# Patient Record
Sex: Male | Born: 1965 | Race: White | Hispanic: No | Marital: Married
Health system: Southern US, Community
[De-identification: ages and names within clinical notes are randomized; demographics above are authoritative.]

---

## 2019-09-01 ENCOUNTER — Encounter (HOSPITAL_COMMUNITY): Payer: Self-pay

## 2019-09-01 ENCOUNTER — Emergency Department (HOSPITAL_COMMUNITY)
Admission: EM | Admit: 2019-09-01 | Discharge: 2019-09-02 | Disposition: A | Discharge status: Home / Self Care | Payer: BC Managed Care – PPO | Attending: Emergency Medicine | Admitting: Emergency Medicine

## 2019-09-01 DIAGNOSIS — R509 Fever, unspecified: Secondary | ICD-10-CM | POA: Diagnosis not present

## 2019-09-01 DIAGNOSIS — J1289 Other viral pneumonia: Secondary | ICD-10-CM | POA: Diagnosis not present

## 2019-09-01 DIAGNOSIS — R197 Diarrhea, unspecified: Secondary | ICD-10-CM | POA: Diagnosis not present

## 2019-09-01 DIAGNOSIS — J1282 Pneumonia due to coronavirus disease 2019: Secondary | ICD-10-CM

## 2019-09-01 DIAGNOSIS — U071 COVID-19: Secondary | ICD-10-CM | POA: Diagnosis not present

## 2019-09-01 DIAGNOSIS — R05 Cough: Secondary | ICD-10-CM | POA: Diagnosis not present

## 2019-09-01 DIAGNOSIS — R0602 Shortness of breath: Secondary | ICD-10-CM | POA: Diagnosis present

## 2019-09-01 NOTE — ED Triage Notes (Signed)
Pt arrives POV with CC of COVID; Tested positive on Dec. 9th, been feeling sick since 7th. Sx progressively worsening with fever, SOB, and dizziness.

## 2019-09-02 ENCOUNTER — Emergency Department (HOSPITAL_COMMUNITY): Payer: BC Managed Care – PPO

## 2019-09-02 MED ORDER — ONDANSETRON 4 MG PO TBDP: 4.0000 mg | ORAL_TABLET | Freq: Three times a day (TID) | ORAL | 0 refills | Status: AC | PRN

## 2019-09-02 NOTE — ED Notes (Signed)
Patient verbalizes understanding of discharge instructions. Opportunity for questioning and answers were provided. Armband removed by staff, pt discharged from ED ambulatory to home.  

## 2019-09-02 NOTE — Discharge Instructions (Signed)
You were seen today for shortness of breath associated with COVID-19.  Your chest x-ray does show findings consistent with Covid pneumonia.  Your oxygen saturation has stayed 98 to 99% in the emergency room.  Monitor oxygen saturations at home with a portable pulse oximeter.  If your oxygen saturations began to drop into the low 90s, you need to be reseen.  Otherwise, take Tylenol or Motrin for fevers and make sure to stay hydrated.

## 2019-09-02 NOTE — ED Provider Notes (Signed)
[facility] EMERGENCY DEPARTMENT Provider Note   CSN: [id number] Arrival date & time: 09/01/19  2301     History Chief Complaint  Patient presents with  . COVID+    Alex Hayes is a 53 y.o. male.  HPI     This is a 54 year old male with no reported past medical history who presents with shortness of breath.  The patient was diagnosed with COVID-19 on December 9.  He started having symptoms on December 7.  Since that time he reports that he has had recurrent fever up to 101.3 which does improve with Tylenol.  However, today he noted shortness of breath.  He was not doing anything exertional or specific when he noted the shortness of breath.  No chest pain.  He states he has had some nausea and a few loose stools that have been "green."  He reports a nonproductive cough.  History reviewed. No pertinent past medical history.  There are no problems to display for this patient.   History reviewed. No pertinent surgical history.     No family history on file.  Social History   Tobacco Use  . Smoking status: Not on file  Substance Use Topics  . Alcohol use: Not on file  . Drug use: Not on file    Home Medications Prior to Admission medications   Medication Sig Start Date End Date Taking? Authorizing Provider  ondansetron (ZOFRAN ODT) 4 MG disintegrating tablet Take 1 tablet (4 mg total) by mouth every 8 (eight) hours as needed for nausea or vomiting. 09/02/19   Riyansh Gerstner, Mayer Masker, MD    Allergies    Patient has no allergy information on record.  Review of Systems   Review of Systems  Constitutional: Positive for fever.  Respiratory: Positive for cough and shortness of breath.   Cardiovascular: Negative for chest pain.  Gastrointestinal: Positive for diarrhea and nausea. Negative for abdominal pain and vomiting.  Genitourinary: Negative for dysuria.  All other systems reviewed and are negative.   Physical Exam Updated Vital Signs BP  119/82   Pulse 76   Temp 99.1 F (37.3 C) (Oral)   Resp 19   SpO2 99%   Physical Exam Vitals and nursing note reviewed.  Constitutional:      General: He is not in acute distress.    Appearance: He is well-developed. He is not ill-appearing or toxic-appearing.  HENT:     Head: Normocephalic and atraumatic.     Mouth/Throat:     Mouth: Mucous membranes are moist.  Eyes:     Pupils: Pupils are equal, round, and reactive to light.  Cardiovascular:     Rate and Rhythm: Normal rate and regular rhythm.     Heart sounds: Normal heart sounds. No murmur.  Pulmonary:     Effort: Pulmonary effort is normal. No respiratory distress.     Breath sounds: Normal breath sounds. No wheezing.  Abdominal:     Palpations: Abdomen is soft.     Tenderness: There is no abdominal tenderness.  Musculoskeletal:     Cervical back: Neck supple.     Right lower leg: No edema.     Left lower leg: No edema.  Skin:    General: Skin is warm and dry.  Neurological:     Mental Status: He is alert and oriented to person, place, and time.  Psychiatric:        Mood and Affect: Mood normal.     ED Results / Procedures /  Treatments   Labs (all labs ordered are listed, but only abnormal results are displayed) Labs Reviewed - No data to display  EKG EKG Interpretation  Date/Time:  Friday September 01 2019 23:55:51 EST Ventricular Rate:  77 PR Interval:    QRS Duration: 80 QT Interval:  358 QTC Calculation: 406 R Axis:   118 Text Interpretation: Right and left arm electrode reversal, interpretation assumes no reversal Sinus or ectopic atrial rhythm Right axis deviation Borderline low voltage, extremity leads Nonspecific T abnormalities, lateral leads No prior for comparison Confirmed by Ross MarcusHorton, Sonya Pucci ([id number]) on 09/02/2019 12:34:31 AM   Radiology DG Chest Portable 1 View  Result Date: 09/02/2019 CLINICAL DATA:  Shortness of breath EXAM: PORTABLE CHEST 1 VIEW COMPARISON:  None. FINDINGS: There is  scattered pulmonary airspace opacities bilaterally. There is no pneumothorax. No large pleural effusion. The heart size is borderline enlarged. There is no acute osseous abnormality. IMPRESSION: Multifocal airspace opacities concerning for multifocal pneumonia (viral or bacterial). Electronically Signed   By: Katherine Mantlehristopher  Green M.D.   On: 09/02/2019 00:52    Procedures Procedures (including critical care time)  Medications Ordered in ED Medications - No data to display  ED Course  I have reviewed the triage vital signs and the nursing notes.  Pertinent labs & imaging results that were available during my care of the patient were reviewed by me and considered in my medical decision making (see chart for details).    MDM Rules/Calculators/A&P                       Patient presents with shortness of breath.  He is on approximately day 11 of COVID-19 illness.  He is overall nontoxic-appearing and vital signs are reassuring.  He is afebrile and O2 sats are 99%.  He is in no acute distress.  Chest x-ray was obtained and chest x-ray findings are consistent with COVID-19 viral pneumonia.  Patient has maintained normal pox oximetry while in the emergency department.  Have low suspicion at this time of further complication including pulmonary embolism as patient's heart rate and pulse ox remained reassuring.  I discussed with him supportive measures at home and obtaining a portable pulse oximeter to monitor O2 sats.  Continue Tylenol or Motrin for any fevers.  Make sure to stay hydrated.  He was given return precautions regarding worsening symptoms.  After history, exam, and medical workup I feel the patient has been appropriately medically screened and is safe for discharge home. Pertinent diagnoses were discussed with the patient. Patient was given return precautions.  Alex Hayes was evaluated in Emergency Department on 09/02/2019 for the symptoms described in the history of present illness.  He was evaluated in the context of the global COVID-19 pandemic, which necessitated consideration that the patient might be at risk for infection with the SARS-CoV-2 virus that causes COVID-19. Institutional protocols and algorithms that pertain to the evaluation of patients at risk for COVID-19 are in a state of rapid change based on information released by regulatory bodies including the CDC and federal and state organizations. These policies and algorithms were followed during the patient's care in the ED.  Marland Kitchen.  Final Clinical Impression(s) / ED Diagnoses Final diagnoses:  Pneumonia due to COVID-19 virus    Rx / DC Orders ED Discharge Orders         Ordered    ondansetron (ZOFRAN ODT) 4 MG disintegrating tablet  Every 8 hours PRN     09/02/19 0130  Merryl Hacker, MD 09/02/19 [phone]

## 2020-05-23 IMAGING — DX DG CHEST 1V PORT
1 series · 1 of 1 positions shown · non-contrast
Comparison: None.

CLINICAL DATA: Shortness of breath

EXAM:
PORTABLE CHEST 1 VIEW

[chest ap]
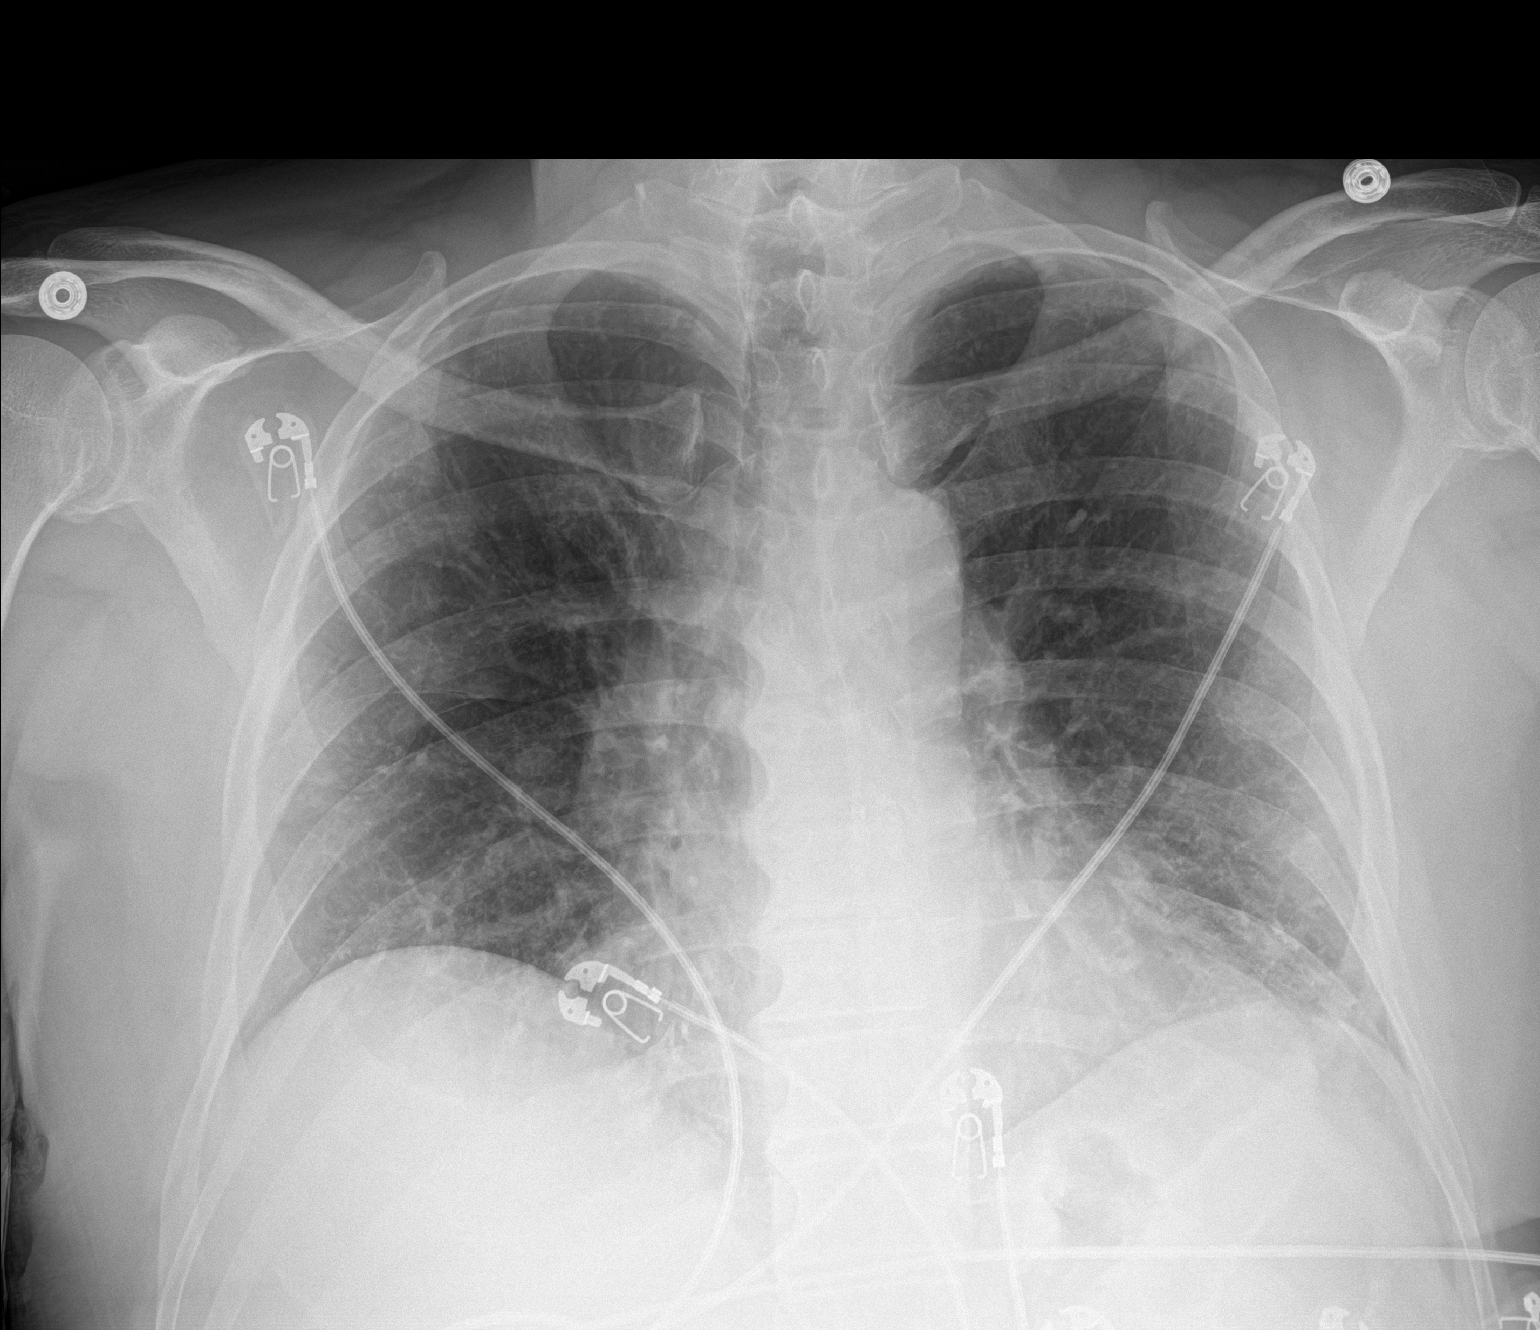

[1 of 1 positions shown; findings below may reference images not displayed]

FINDINGS: There is scattered pulmonary airspace opacities bilaterally. There
is no pneumothorax. No large pleural effusion. The heart size is
borderline enlarged. There is no acute osseous abnormality.
IMPRESSION: Multifocal airspace opacities concerning for multifocal pneumonia
(viral or bacterial).

## 2024-03-31 ENCOUNTER — Encounter: Payer: Self-pay | Admitting: Advanced Practice Midwife
